# Patient Record
Sex: Male | Born: 2005 | Race: Black or African American | Hispanic: No | Marital: Single | Smoking: Never smoker
Health system: Southern US, Community
[De-identification: ages and names within clinical notes are randomized; demographics above are authoritative.]

---
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
## 2023-10-12 ENCOUNTER — Ambulatory Visit (HOSPITAL_COMMUNITY): Payer: Self-pay
